# Patient Record
Sex: Male | Born: 2005
Health system: Southern US, Community
[De-identification: ages and names within clinical notes are randomized; demographics above are authoritative.]

## PROBLEM LIST (undated history)

## (undated) DIAGNOSIS — R51 Headache: Secondary | ICD-10-CM

## (undated) DIAGNOSIS — J029 Acute pharyngitis, unspecified: Secondary | ICD-10-CM

## (undated) DIAGNOSIS — K8000 Calculus of gallbladder with acute cholecystitis without obstruction: Secondary | ICD-10-CM

## (undated) DIAGNOSIS — R519 Headache, unspecified: Secondary | ICD-10-CM

## (undated) DIAGNOSIS — K8020 Calculus of gallbladder without cholecystitis without obstruction: Secondary | ICD-10-CM

## (undated) HISTORY — PX: OTHER SURGICAL HISTORY: SHX169

---
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
## 2023-03-24 ENCOUNTER — Encounter (INDEPENDENT_AMBULATORY_CARE_PROVIDER_SITE_OTHER): Payer: Self-pay

## 2023-06-23 DIAGNOSIS — L03011 Cellulitis of right finger: Secondary | ICD-10-CM | POA: Diagnosis not present

## 2023-06-23 DIAGNOSIS — L02511 Cutaneous abscess of right hand: Secondary | ICD-10-CM | POA: Diagnosis not present

## 2023-07-29 ENCOUNTER — Encounter: Payer: Self-pay | Admitting: Pediatrics

## 2024-07-04 ENCOUNTER — Emergency Department (HOSPITAL_COMMUNITY)
Admission: EM | Admit: 2024-07-04 | Discharge: 2024-07-04 | Disposition: A | Discharge status: Home / Self Care | Payer: Self-pay | Attending: Emergency Medicine | Admitting: Emergency Medicine

## 2024-07-04 ENCOUNTER — Encounter (HOSPITAL_COMMUNITY): Payer: Self-pay | Admitting: Emergency Medicine

## 2024-07-04 ENCOUNTER — Other Ambulatory Visit: Payer: Self-pay

## 2024-07-04 DIAGNOSIS — U071 COVID-19: Secondary | ICD-10-CM | POA: Insufficient documentation

## 2024-07-04 LAB — RESP PANEL BY RT-PCR (RSV, FLU A&B, COVID)  RVPGX2
Influenza A by PCR: NEGATIVE
Influenza B by PCR: NEGATIVE
Resp Syncytial Virus by PCR: NEGATIVE
SARS Coronavirus 2 by RT PCR: POSITIVE — AB

## 2024-07-04 LAB — GROUP A STREP BY PCR: Group A Strep by PCR: NOT DETECTED

## 2024-07-04 MED ORDER — ACETAMINOPHEN 500 MG PO TABS
1000.0000 mg | ORAL_TABLET | Freq: Once | ORAL | Status: AC
  Administered 2024-07-04: 1000 mg via ORAL
  Filled 2024-07-04: qty 2

## 2024-07-04 NOTE — ED Triage Notes (Signed)
 Pt presents to the ED via POV with complaints of sore throat, body aches, and intermittent fevers since returning from [place] 2 days ago. He notes being there for a month. A&Ox4 at this time. Denies CP or SOB.

## 2024-07-04 NOTE — ED Provider Notes (Signed)
 [place] EMERGENCY DEPARTMENT AT [facility] Provider Note   CSN: [id number] Arrival date & time: 07/04/24  0153     Patient presents with: Fever and Sore Throat   Eric [name] is a 18 y.o. male who presents with concern for 2 days of fevers, body aches, chills, sore throat, nausea, and NBNB emesis.  Arrived home from [place] 2 days ago; was there traveling for 1 month.  Patient states he is up-to-date on all childhood immunizations, accompanied by his father at the bedside.  No one else in the home is ill.  No diarrhea, shortness of breath, headache or syncope.   HPI     Prior to Admission medications   Medication Sig Start Date End Date Taking? Authorizing Provider  acetaminophen  (TYLENOL ) 500 MG tablet Take 2 tablets (1,000 mg total) by mouth every 6 (six) hours as needed for mild pain or moderate pain. 09/09/21   Adibe, Obinna O, MD  hydrOXYzine  (ATARAX ) 25 MG tablet TAKE 1 TABLET(25 MG) BY MOUTH TWICE DAILY AS NEEDED 10/01/22   Klett, Macario HERO, NP  ibuprofen  (ADVIL ) 600 MG tablet Take 1 tablet (600 mg total) by mouth every 6 (six) hours as needed for mild pain or moderate pain. 09/09/21   Adibe, Obinna O, MD  oxyCODONE  (OXY IR/ROXICODONE ) 5 MG immediate release tablet Take 1 tablet (5 mg total) by mouth every 4 (four) hours as needed for moderate pain, severe pain or breakthrough pain. Patient not taking: Reported on 09/18/2021 09/09/21   Adibe, Obinna O, MD    Allergies: Pork-derived products    Review of Systems  Constitutional:  Positive for appetite change, chills, fatigue and fever.  HENT:  Positive for congestion, sinus pressure and sore throat.   Respiratory:  Positive for cough. Negative for shortness of breath.   Cardiovascular: Negative.   Gastrointestinal:  Positive for nausea and vomiting. Negative for abdominal pain and diarrhea.  Genitourinary: Negative.   Musculoskeletal: Negative.   Neurological: Negative.     Updated Vital Signs BP  (!) 105/51   Pulse 95   Temp 98.7 F (37.1 C) (Oral)   Resp 17   Ht 6' 2 (1.88 m)   Wt 82.2 kg   SpO2 96%   BMI 23.27 kg/m   Physical Exam Vitals and nursing note reviewed.  Constitutional:      Appearance: He is not ill-appearing or toxic-appearing.  HENT:     Head: Normocephalic and atraumatic.     Mouth/Throat:     Mouth: Mucous membranes are moist.     Pharynx: Oropharynx is clear. Uvula midline. Posterior oropharyngeal erythema present. No oropharyngeal exudate.  Eyes:     General:        Right eye: No discharge.        Left eye: No discharge.     Conjunctiva/sclera: Conjunctivae normal.     Pupils: Pupils are equal, round, and reactive to light.  Cardiovascular:     Rate and Rhythm: Regular rhythm. Tachycardia present.     Pulses: Normal pulses.     Heart sounds: Normal heart sounds. No murmur heard. Pulmonary:     Effort: Pulmonary effort is normal. No respiratory distress.     Breath sounds: Normal breath sounds. No wheezing or rales.  Abdominal:     General: Bowel sounds are normal. There is no distension.     Palpations: Abdomen is soft.     Tenderness: There is no abdominal tenderness. There is no right CVA tenderness, left  CVA tenderness, guarding or rebound.  Musculoskeletal:        General: No deformity.     Cervical back: Neck supple.     Right lower leg: No edema.     Left lower leg: No edema.  Skin:    General: Skin is warm and dry.     Capillary Refill: Capillary refill takes less than 2 seconds.     Findings: No rash.  Neurological:     General: No focal deficit present.     Mental Status: He is alert and oriented to person, place, and time. Mental status is at baseline.  Psychiatric:        Mood and Affect: Mood normal.     (all labs ordered are listed, but only abnormal results are displayed) Labs Reviewed  RESP PANEL BY RT-PCR (RSV, FLU A&B, COVID)  [id number] - Abnormal; Notable for the following components:      Result Value   SARS  Coronavirus 2 by RT PCR POSITIVE (*)    All other components within normal limits  GROUP A STREP BY PCR    EKG: None  Radiology: No results found.   Procedures   Medications Ordered in the ED  acetaminophen  (TYLENOL ) tablet 1,000 mg (1,000 mg Oral Given 07/04/24 9786)                                    Medical Decision Making 18 year old male presents with concern for viral syndrome symptoms.  Febrile and mildly tachycardic on intake, vital signs otherwise reassuring.  Cardiopulmonary abdominal exams are reassuring.  Mildly tachycardic at time of my evaluation with regular rhythm, heart rate of 104.  Posterior pharyngeal erythema without exudate or lesions.  Amount and/or Complexity of Data Reviewed Labs:     Details: Test negative, RVP positive for COVID-19.  Risk OTC drugs.   Clinical history most consistent with acute viral syndrome secondary to COVID-19 infection.  Clinical concern for emergent underlying condition or further ED workup patient management is exceedingly low.  Patient offered intravenous rehydration versus p.o. challenge and oral rehydration at home.  Patient passed p.o. challenge in the ED and has strong preference to be discharged home at this time for oral rehydration therapy at home.  Feel this is reasonable.  Andriel voiced understanding of his  medical evaluation and treatment plan. Each of their questions answered to their expressed satisfaction.  Return precautions were given.  Patient is well-appearing, stable, and was discharged in good condition.   This chart was dictated using voice recognition software, Dragon. Despite the best efforts of this provider to proofread and correct errors, errors may still occur which can change documentation meaning.      Final diagnoses:  COVID-19    ED Discharge Orders     None          Bobette Pleasant JONELLE DEVONNA 07/04/24 0426    Trine Raynell Moder, MD 07/04/24 [phone]

## 2024-07-04 NOTE — Discharge Instructions (Signed)
 You have COVID-19. You may use over the counter medication such as tylenol  and ibuprofen  as needed for fever, bodyaches, and sore throat. Follow up with your primary care doctors and return to the ER with any new severe symptoms.
